# Patient Record
Sex: Female | Born: 1945 | Race: White | Hispanic: No | Marital: Single
Health system: Southern US, Community
[De-identification: ages and names within clinical notes are randomized; demographics above are authoritative.]

## PROBLEM LIST (undated history)

## (undated) HISTORY — PX: MASTECTOMY: SHX3

## (undated) HISTORY — PX: BREAST BIOPSY: SHX20

---

## 1998-03-22 ENCOUNTER — Encounter: Payer: Self-pay | Admitting: Family Medicine

## 1998-03-22 ENCOUNTER — Ambulatory Visit (HOSPITAL_COMMUNITY): Admission: RE | Admit: 1998-03-22 | Discharge: 1998-03-22 | Payer: Self-pay | Admitting: Family Medicine

## 1999-05-02 ENCOUNTER — Ambulatory Visit (HOSPITAL_COMMUNITY): Admission: RE | Admit: 1999-05-02 | Discharge: 1999-05-02 | Payer: Self-pay | Admitting: Family Medicine

## 1999-05-02 ENCOUNTER — Encounter: Payer: Self-pay | Admitting: Family Medicine

## 2000-03-11 ENCOUNTER — Encounter: Payer: Self-pay | Admitting: Family Medicine

## 2000-03-11 ENCOUNTER — Encounter: Admission: RE | Admit: 2000-03-11 | Discharge: 2000-03-11 | Payer: Self-pay | Admitting: Family Medicine

## 2000-05-06 ENCOUNTER — Encounter: Payer: Self-pay | Admitting: Family Medicine

## 2000-05-06 ENCOUNTER — Ambulatory Visit (HOSPITAL_COMMUNITY): Admission: RE | Admit: 2000-05-06 | Discharge: 2000-05-06 | Payer: Self-pay | Admitting: Family Medicine

## 2001-05-13 ENCOUNTER — Encounter: Payer: Self-pay | Admitting: Family Medicine

## 2001-05-13 ENCOUNTER — Ambulatory Visit (HOSPITAL_COMMUNITY): Admission: RE | Admit: 2001-05-13 | Discharge: 2001-05-13 | Payer: Self-pay | Admitting: Family Medicine

## 2002-06-18 ENCOUNTER — Ambulatory Visit (HOSPITAL_COMMUNITY): Admission: RE | Admit: 2002-06-18 | Discharge: 2002-06-18 | Payer: Self-pay | Admitting: Family Medicine

## 2002-06-18 ENCOUNTER — Encounter: Payer: Self-pay | Admitting: Family Medicine

## 2003-01-13 ENCOUNTER — Encounter: Admission: RE | Admit: 2003-01-13 | Discharge: 2003-01-13 | Payer: Self-pay | Admitting: Family Medicine

## 2003-01-13 ENCOUNTER — Encounter: Payer: Self-pay | Admitting: Family Medicine

## 2003-10-26 ENCOUNTER — Ambulatory Visit (HOSPITAL_COMMUNITY): Admission: RE | Admit: 2003-10-26 | Discharge: 2003-10-26 | Payer: Self-pay | Admitting: Family Medicine

## 2004-11-13 ENCOUNTER — Ambulatory Visit (HOSPITAL_COMMUNITY): Admission: RE | Admit: 2004-11-13 | Discharge: 2004-11-13 | Payer: Self-pay | Admitting: Family Medicine

## 2005-03-06 ENCOUNTER — Ambulatory Visit (HOSPITAL_COMMUNITY): Admission: RE | Admit: 2005-03-06 | Discharge: 2005-03-06 | Payer: Self-pay | Admitting: Gastroenterology

## 2006-01-17 ENCOUNTER — Ambulatory Visit (HOSPITAL_COMMUNITY): Admission: RE | Admit: 2006-01-17 | Discharge: 2006-01-17 | Payer: Self-pay | Admitting: Family Medicine

## 2007-02-26 ENCOUNTER — Ambulatory Visit (HOSPITAL_COMMUNITY): Admission: RE | Admit: 2007-02-26 | Discharge: 2007-02-26 | Payer: Self-pay | Admitting: Family Medicine

## 2008-03-30 ENCOUNTER — Ambulatory Visit (HOSPITAL_BASED_OUTPATIENT_CLINIC_OR_DEPARTMENT_OTHER): Admission: RE | Admit: 2008-03-30 | Discharge: 2008-03-30 | Payer: Self-pay | Admitting: Family Medicine

## 2008-04-29 ENCOUNTER — Ambulatory Visit (HOSPITAL_COMMUNITY): Admission: RE | Admit: 2008-04-29 | Discharge: 2008-04-29 | Payer: Self-pay | Admitting: Family Medicine

## 2009-04-07 ENCOUNTER — Ambulatory Visit: Payer: Self-pay | Admitting: Diagnostic Radiology

## 2009-04-07 ENCOUNTER — Ambulatory Visit (HOSPITAL_BASED_OUTPATIENT_CLINIC_OR_DEPARTMENT_OTHER): Admission: RE | Admit: 2009-04-07 | Discharge: 2009-04-07 | Payer: Self-pay | Admitting: Family Medicine

## 2009-05-12 ENCOUNTER — Ambulatory Visit (HOSPITAL_COMMUNITY): Admission: RE | Admit: 2009-05-12 | Discharge: 2009-05-12 | Payer: Self-pay | Admitting: Family Medicine

## 2010-04-18 ENCOUNTER — Ambulatory Visit: Payer: Self-pay | Admitting: Diagnostic Radiology

## 2010-04-18 ENCOUNTER — Ambulatory Visit (HOSPITAL_BASED_OUTPATIENT_CLINIC_OR_DEPARTMENT_OTHER): Admission: RE | Admit: 2010-04-18 | Discharge: 2010-04-18 | Payer: Self-pay | Admitting: Family Medicine

## 2010-08-27 ENCOUNTER — Encounter: Payer: Self-pay | Admitting: Family Medicine

## 2011-04-17 ENCOUNTER — Other Ambulatory Visit (HOSPITAL_BASED_OUTPATIENT_CLINIC_OR_DEPARTMENT_OTHER): Payer: Self-pay | Admitting: Family Medicine

## 2011-04-17 DIAGNOSIS — Z139 Encounter for screening, unspecified: Secondary | ICD-10-CM

## 2011-04-24 ENCOUNTER — Ambulatory Visit (HOSPITAL_BASED_OUTPATIENT_CLINIC_OR_DEPARTMENT_OTHER)
Admission: RE | Admit: 2011-04-24 | Discharge: 2011-04-24 | Disposition: A | Discharge status: Home / Self Care | Payer: Medicare Other | Source: Ambulatory Visit | Attending: Family Medicine | Admitting: Family Medicine

## 2011-04-24 DIAGNOSIS — Z139 Encounter for screening, unspecified: Secondary | ICD-10-CM

## 2011-04-24 DIAGNOSIS — Z1231 Encounter for screening mammogram for malignant neoplasm of breast: Secondary | ICD-10-CM

## 2011-05-08 ENCOUNTER — Other Ambulatory Visit (HOSPITAL_COMMUNITY): Payer: Self-pay | Admitting: Family Medicine

## 2011-05-16 ENCOUNTER — Ambulatory Visit (HOSPITAL_COMMUNITY): Payer: Medicare Other

## 2011-05-18 ENCOUNTER — Ambulatory Visit (HOSPITAL_COMMUNITY)
Admission: RE | Admit: 2011-05-18 | Discharge: 2011-05-18 | Disposition: A | Discharge status: Home / Self Care | Payer: Medicare Other | Source: Ambulatory Visit | Attending: Family Medicine | Admitting: Family Medicine

## 2011-05-18 DIAGNOSIS — Z780 Asymptomatic menopausal state: Secondary | ICD-10-CM | POA: Insufficient documentation

## 2011-05-18 DIAGNOSIS — Z13820 Encounter for screening for osteoporosis: Secondary | ICD-10-CM | POA: Insufficient documentation

## 2012-04-18 ENCOUNTER — Other Ambulatory Visit (HOSPITAL_BASED_OUTPATIENT_CLINIC_OR_DEPARTMENT_OTHER): Payer: Self-pay | Admitting: Family Medicine

## 2012-04-18 DIAGNOSIS — Z139 Encounter for screening, unspecified: Secondary | ICD-10-CM

## 2012-04-24 ENCOUNTER — Ambulatory Visit (HOSPITAL_BASED_OUTPATIENT_CLINIC_OR_DEPARTMENT_OTHER)
Admission: RE | Admit: 2012-04-24 | Discharge: 2012-04-24 | Disposition: A | Discharge status: Home / Self Care | Payer: Medicare Other | Source: Ambulatory Visit | Attending: Family Medicine | Admitting: Family Medicine

## 2012-04-24 DIAGNOSIS — Z139 Encounter for screening, unspecified: Secondary | ICD-10-CM

## 2012-04-24 DIAGNOSIS — Z1231 Encounter for screening mammogram for malignant neoplasm of breast: Secondary | ICD-10-CM | POA: Insufficient documentation

## 2013-04-20 ENCOUNTER — Other Ambulatory Visit (HOSPITAL_BASED_OUTPATIENT_CLINIC_OR_DEPARTMENT_OTHER): Payer: Self-pay | Admitting: Family Medicine

## 2013-04-20 DIAGNOSIS — Z1231 Encounter for screening mammogram for malignant neoplasm of breast: Secondary | ICD-10-CM

## 2013-04-27 ENCOUNTER — Ambulatory Visit (HOSPITAL_BASED_OUTPATIENT_CLINIC_OR_DEPARTMENT_OTHER)
Admission: RE | Admit: 2013-04-27 | Discharge: 2013-04-27 | Disposition: A | Discharge status: Home / Self Care | Payer: Medicare PPO | Source: Ambulatory Visit | Attending: Family Medicine | Admitting: Family Medicine

## 2013-04-27 DIAGNOSIS — Z1231 Encounter for screening mammogram for malignant neoplasm of breast: Secondary | ICD-10-CM

## 2014-04-27 ENCOUNTER — Other Ambulatory Visit (HOSPITAL_BASED_OUTPATIENT_CLINIC_OR_DEPARTMENT_OTHER): Payer: Self-pay | Admitting: Family Medicine

## 2014-04-27 DIAGNOSIS — Z1231 Encounter for screening mammogram for malignant neoplasm of breast: Secondary | ICD-10-CM

## 2014-05-10 ENCOUNTER — Ambulatory Visit (HOSPITAL_BASED_OUTPATIENT_CLINIC_OR_DEPARTMENT_OTHER)
Admission: RE | Admit: 2014-05-10 | Discharge: 2014-05-10 | Disposition: A | Discharge status: Home / Self Care | Payer: Medicare PPO | Source: Ambulatory Visit | Attending: Family Medicine | Admitting: Family Medicine

## 2014-05-10 DIAGNOSIS — Z1231 Encounter for screening mammogram for malignant neoplasm of breast: Secondary | ICD-10-CM

## 2014-12-23 ENCOUNTER — Telehealth: Payer: Self-pay | Admitting: Internal Medicine

## 2014-12-23 NOTE — Telephone Encounter (Signed)
[facility] 4 pages to Dr. Rhea BeltonPyrtle

## 2015-04-22 ENCOUNTER — Encounter: Payer: Self-pay | Admitting: Gastroenterology

## 2015-05-10 ENCOUNTER — Other Ambulatory Visit (HOSPITAL_BASED_OUTPATIENT_CLINIC_OR_DEPARTMENT_OTHER): Payer: Self-pay | Admitting: Family Medicine

## 2015-05-10 DIAGNOSIS — Z1231 Encounter for screening mammogram for malignant neoplasm of breast: Secondary | ICD-10-CM

## 2015-05-13 ENCOUNTER — Ambulatory Visit (HOSPITAL_BASED_OUTPATIENT_CLINIC_OR_DEPARTMENT_OTHER)
Admission: RE | Admit: 2015-05-13 | Discharge: 2015-05-13 | Disposition: A | Discharge status: Home / Self Care | Payer: Medicare PPO | Source: Ambulatory Visit | Attending: Family Medicine | Admitting: Family Medicine

## 2015-05-13 DIAGNOSIS — Z1231 Encounter for screening mammogram for malignant neoplasm of breast: Secondary | ICD-10-CM

## 2016-05-08 ENCOUNTER — Other Ambulatory Visit (HOSPITAL_BASED_OUTPATIENT_CLINIC_OR_DEPARTMENT_OTHER): Payer: Self-pay | Admitting: Family Medicine

## 2016-05-08 DIAGNOSIS — Z1231 Encounter for screening mammogram for malignant neoplasm of breast: Secondary | ICD-10-CM

## 2016-05-14 ENCOUNTER — Ambulatory Visit (HOSPITAL_BASED_OUTPATIENT_CLINIC_OR_DEPARTMENT_OTHER)
Admission: RE | Admit: 2016-05-14 | Discharge: 2016-05-14 | Disposition: A | Discharge status: Home / Self Care | Payer: Medicare Other | Source: Ambulatory Visit | Attending: Family Medicine | Admitting: Family Medicine

## 2016-05-14 DIAGNOSIS — Z1231 Encounter for screening mammogram for malignant neoplasm of breast: Secondary | ICD-10-CM | POA: Insufficient documentation

## 2017-05-13 ENCOUNTER — Other Ambulatory Visit (HOSPITAL_BASED_OUTPATIENT_CLINIC_OR_DEPARTMENT_OTHER): Payer: Self-pay | Admitting: Family Medicine

## 2017-05-13 DIAGNOSIS — Z1231 Encounter for screening mammogram for malignant neoplasm of breast: Secondary | ICD-10-CM

## 2017-05-21 ENCOUNTER — Ambulatory Visit (HOSPITAL_BASED_OUTPATIENT_CLINIC_OR_DEPARTMENT_OTHER)
Admission: RE | Admit: 2017-05-21 | Discharge: 2017-05-21 | Disposition: A | Discharge status: Home / Self Care | Payer: Medicare Other | Source: Ambulatory Visit | Attending: Family Medicine | Admitting: Family Medicine

## 2017-05-21 ENCOUNTER — Encounter (HOSPITAL_BASED_OUTPATIENT_CLINIC_OR_DEPARTMENT_OTHER): Payer: Self-pay

## 2017-05-21 DIAGNOSIS — Z1231 Encounter for screening mammogram for malignant neoplasm of breast: Secondary | ICD-10-CM | POA: Insufficient documentation

## 2018-05-12 ENCOUNTER — Other Ambulatory Visit (HOSPITAL_BASED_OUTPATIENT_CLINIC_OR_DEPARTMENT_OTHER): Payer: Self-pay | Admitting: Family Medicine

## 2018-05-12 DIAGNOSIS — Z1231 Encounter for screening mammogram for malignant neoplasm of breast: Secondary | ICD-10-CM

## 2018-05-22 ENCOUNTER — Ambulatory Visit (HOSPITAL_BASED_OUTPATIENT_CLINIC_OR_DEPARTMENT_OTHER)
Admission: RE | Admit: 2018-05-22 | Discharge: 2018-05-22 | Disposition: A | Discharge status: Home / Self Care | Payer: Medicare Other | Source: Ambulatory Visit | Attending: Family Medicine | Admitting: Family Medicine

## 2018-05-22 DIAGNOSIS — Z1231 Encounter for screening mammogram for malignant neoplasm of breast: Secondary | ICD-10-CM | POA: Insufficient documentation

## 2019-05-15 ENCOUNTER — Other Ambulatory Visit (HOSPITAL_BASED_OUTPATIENT_CLINIC_OR_DEPARTMENT_OTHER): Payer: Self-pay | Admitting: Family Medicine

## 2019-05-15 DIAGNOSIS — Z1239 Encounter for other screening for malignant neoplasm of breast: Secondary | ICD-10-CM

## 2019-05-25 ENCOUNTER — Ambulatory Visit (HOSPITAL_BASED_OUTPATIENT_CLINIC_OR_DEPARTMENT_OTHER)
Admission: RE | Admit: 2019-05-25 | Discharge: 2019-05-25 | Disposition: A | Discharge status: Home / Self Care | Payer: Medicare Other | Source: Ambulatory Visit | Attending: Family Medicine | Admitting: Family Medicine

## 2019-05-25 ENCOUNTER — Other Ambulatory Visit: Payer: Self-pay

## 2019-05-25 ENCOUNTER — Encounter (HOSPITAL_BASED_OUTPATIENT_CLINIC_OR_DEPARTMENT_OTHER): Payer: Self-pay

## 2019-05-25 DIAGNOSIS — Z1239 Encounter for other screening for malignant neoplasm of breast: Secondary | ICD-10-CM

## 2019-05-25 DIAGNOSIS — Z1231 Encounter for screening mammogram for malignant neoplasm of breast: Secondary | ICD-10-CM | POA: Diagnosis not present

## 2019-05-25 DIAGNOSIS — Z9011 Acquired absence of right breast and nipple: Secondary | ICD-10-CM | POA: Diagnosis not present

## 2019-10-16 ENCOUNTER — Ambulatory Visit: Payer: Medicare PPO | Attending: Internal Medicine

## 2019-10-16 DIAGNOSIS — Z23 Encounter for immunization: Secondary | ICD-10-CM

## 2019-10-16 NOTE — Progress Notes (Signed)
   Covid-19 Vaccination Clinic  Name:  KIYOMI PALLO    MRN: [id number] DOB: Dec 02, 1945  10/16/2019  Ms. Coull was observed post Covid-19 immunization for 15 minutes without incident. She was provided with Vaccine Information Sheet and instruction to access the V-Safe system.   Ms. Mckinzie was instructed to call 911 with any severe reactions post vaccine: Marland Kitchen Difficulty breathing  . Swelling of face and throat  . A fast heartbeat  . A bad rash all over body  . Dizziness and weakness   Immunizations Administered    Name Date Dose VIS Date Route   Pfizer COVID-19 Vaccine 10/16/2019  9:03 AM 0.3 mL 07/17/2019 Intramuscular   Manufacturer: [employer], [employer]   Lot: [id number]   NDC: [address]-0

## 2019-11-09 ENCOUNTER — Ambulatory Visit: Payer: Medicare PPO | Attending: Internal Medicine

## 2019-11-09 DIAGNOSIS — Z23 Encounter for immunization: Secondary | ICD-10-CM

## 2019-11-09 NOTE — Progress Notes (Signed)
   Covid-19 Vaccination Clinic  Name:  Linda Doyle    MRN: [id number] DOB: Feb 11, 1946  11/09/2019  [name] was observed post Covid-19 immunization for 15 minutes without incident. She was provided with Vaccine Information Sheet and instruction to access the V-Safe system.   [name] was instructed to call 911 with any severe reactions post vaccine: Marland Kitchen Difficulty breathing  . Swelling of face and throat  . A fast heartbeat  . A bad rash all over body  . Dizziness and weakness   Immunizations Administered    Name Date Dose VIS Date Route   Pfizer COVID-19 Vaccine 11/09/2019 11:39 AM 0.3 mL 07/17/2019 Intramuscular   Manufacturer: [employer], [employer]   Lot: [id number]   NDC: [address]-1

## 2020-01-26 LAB — COLOGUARD: COLOGUARD: NEGATIVE

## 2020-05-11 ENCOUNTER — Other Ambulatory Visit (HOSPITAL_BASED_OUTPATIENT_CLINIC_OR_DEPARTMENT_OTHER): Payer: Self-pay | Admitting: Family Medicine

## 2020-05-11 DIAGNOSIS — Z1231 Encounter for screening mammogram for malignant neoplasm of breast: Secondary | ICD-10-CM

## 2020-06-20 ENCOUNTER — Other Ambulatory Visit (HOSPITAL_BASED_OUTPATIENT_CLINIC_OR_DEPARTMENT_OTHER): Payer: Self-pay | Admitting: Family Medicine

## 2020-06-20 ENCOUNTER — Encounter (HOSPITAL_BASED_OUTPATIENT_CLINIC_OR_DEPARTMENT_OTHER): Payer: Self-pay

## 2020-06-20 ENCOUNTER — Ambulatory Visit (HOSPITAL_BASED_OUTPATIENT_CLINIC_OR_DEPARTMENT_OTHER)
Admission: RE | Admit: 2020-06-20 | Discharge: 2020-06-20 | Disposition: A | Discharge status: Home / Self Care | Payer: Medicare PPO | Source: Ambulatory Visit | Attending: Family Medicine | Admitting: Family Medicine

## 2020-06-20 ENCOUNTER — Other Ambulatory Visit: Payer: Self-pay

## 2020-06-20 DIAGNOSIS — Z1231 Encounter for screening mammogram for malignant neoplasm of breast: Secondary | ICD-10-CM | POA: Diagnosis not present

## 2021-04-02 IMAGING — MG DIGITAL SCREENING UNILAT LEFT W/ TOMO W/ CAD
4 series · 4 of 12 positions shown · non-contrast
Comparison: Previous exam(s).

CLINICAL DATA: Screening.

EXAM:
DIGITAL SCREENING UNILATERAL LEFT MAMMOGRAM WITH CAD AND TOMO

[L MLO synth-2D]
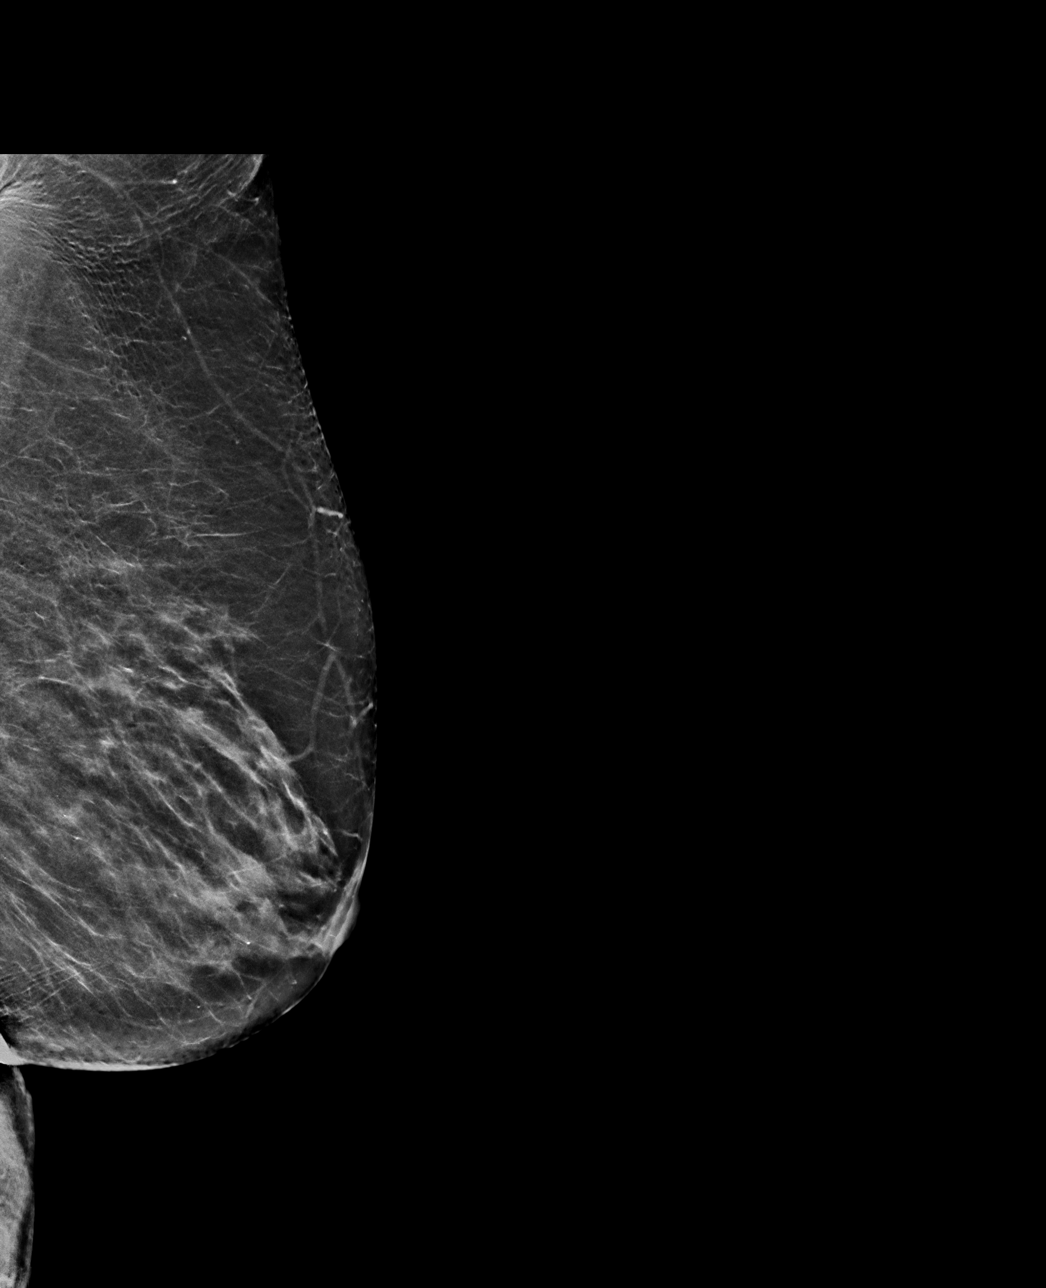

[L CC synth-2D]
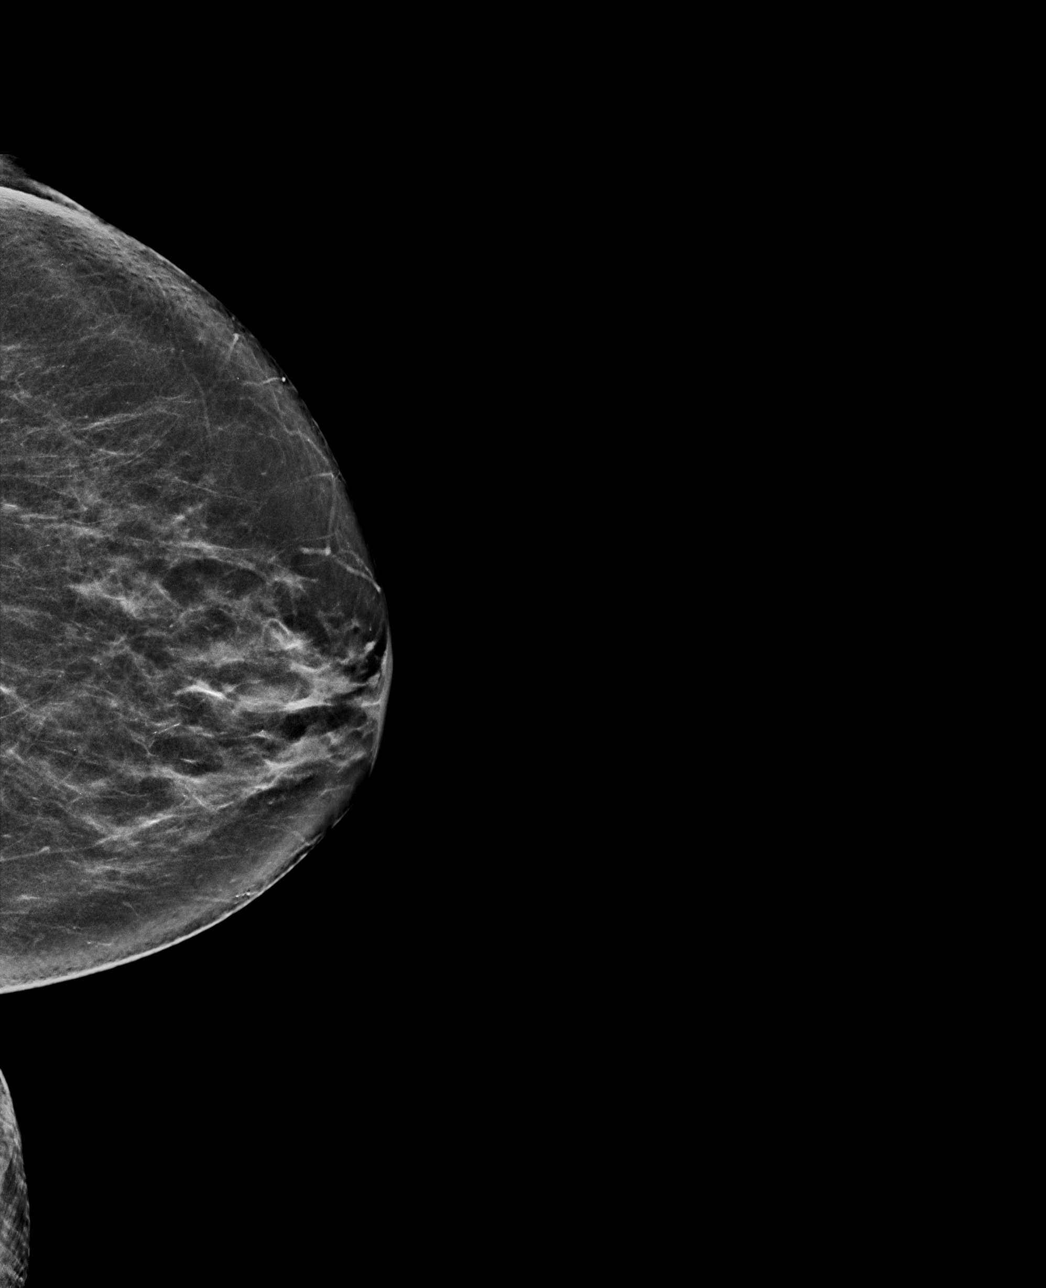

[L CC tomo · tomo slice 31/60.0]
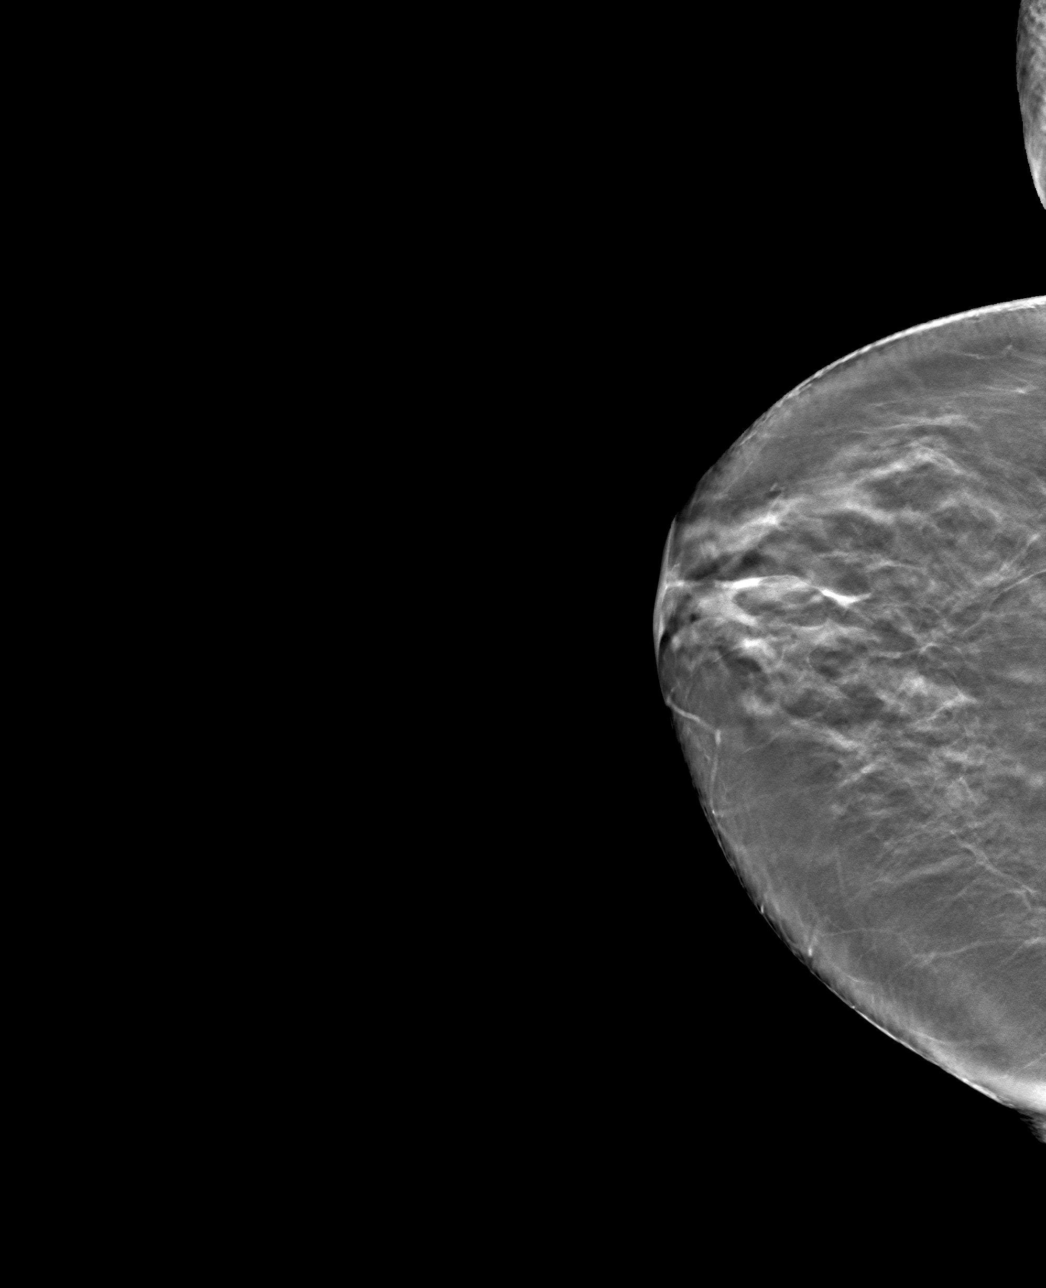

[L MLO tomo · tomo slice 33/64.0]
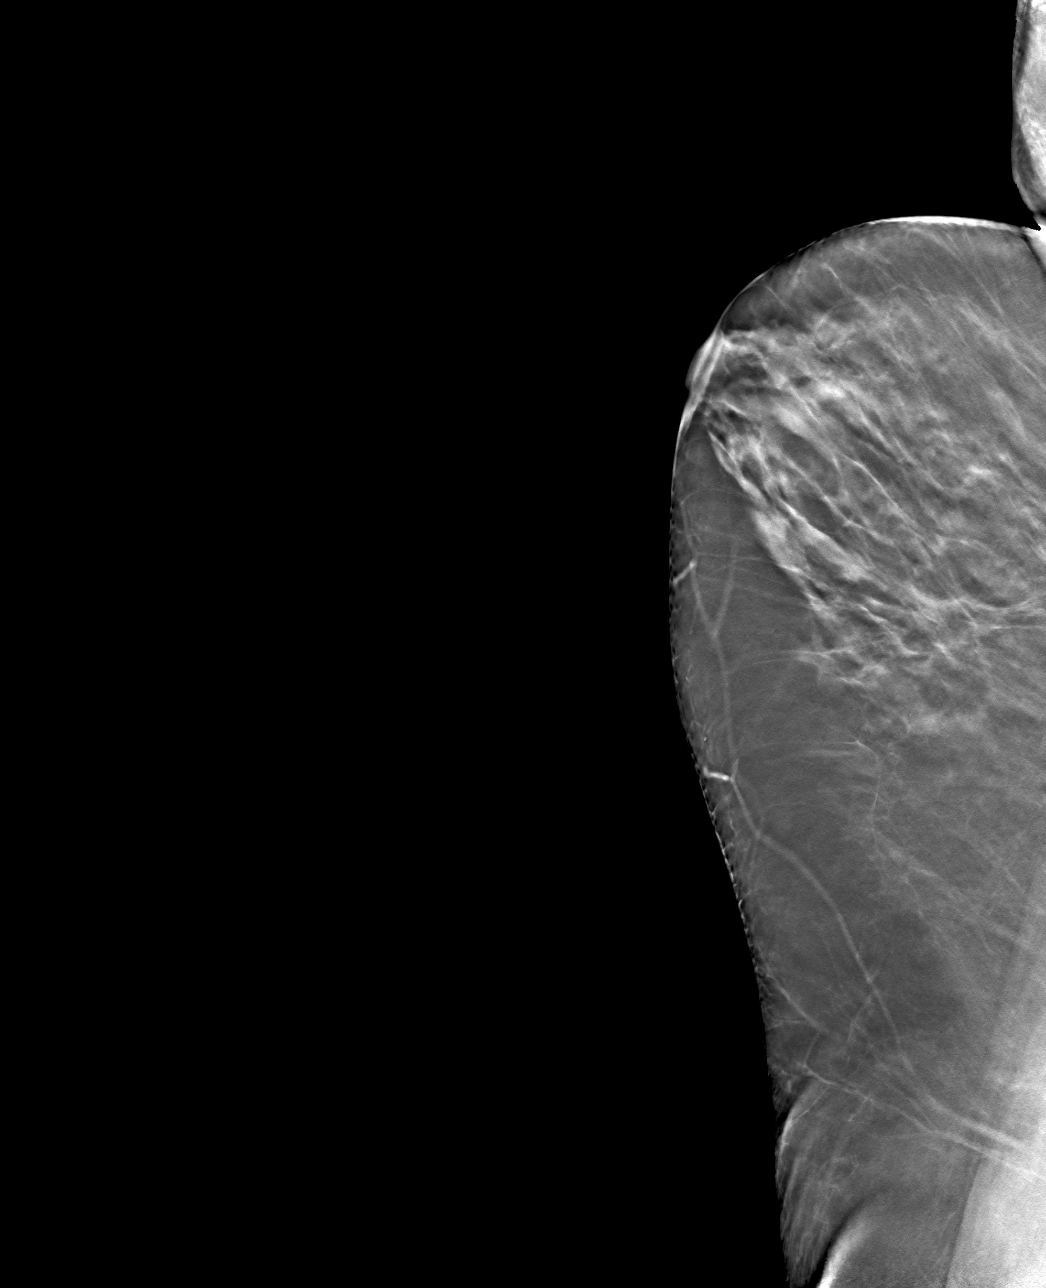

[4 of 12 positions shown; findings below may reference images not displayed]

ACR Breast Density Category c: The breast tissue is heterogeneously
dense, which may obscure small masses.
FINDINGS: The patient has had a right mastectomy. There are no findings
suspicious for malignancy.

Images were processed with CAD.
IMPRESSION: No mammographic evidence of malignancy. A result letter of this
screening mammogram will be mailed directly to the patient.

RECOMMENDATION:
Screening mammogram in one year.  (Code:SI-6-R6I)

BI-RADS CATEGORY  1: Negative.

## 2021-05-23 ENCOUNTER — Other Ambulatory Visit (HOSPITAL_BASED_OUTPATIENT_CLINIC_OR_DEPARTMENT_OTHER): Payer: Self-pay | Admitting: Family Medicine

## 2021-05-23 DIAGNOSIS — Z1231 Encounter for screening mammogram for malignant neoplasm of breast: Secondary | ICD-10-CM

## 2021-07-04 ENCOUNTER — Ambulatory Visit (HOSPITAL_BASED_OUTPATIENT_CLINIC_OR_DEPARTMENT_OTHER)
Admission: RE | Admit: 2021-07-04 | Discharge: 2021-07-04 | Disposition: A | Discharge status: Home / Self Care | Payer: Medicare PPO | Source: Ambulatory Visit | Attending: Family Medicine | Admitting: Family Medicine

## 2021-07-04 ENCOUNTER — Other Ambulatory Visit: Payer: Self-pay

## 2021-07-04 DIAGNOSIS — Z1231 Encounter for screening mammogram for malignant neoplasm of breast: Secondary | ICD-10-CM | POA: Insufficient documentation

## 2021-07-06 ENCOUNTER — Other Ambulatory Visit (HOSPITAL_BASED_OUTPATIENT_CLINIC_OR_DEPARTMENT_OTHER): Payer: Self-pay | Admitting: Family Medicine

## 2021-07-06 DIAGNOSIS — E041 Nontoxic single thyroid nodule: Secondary | ICD-10-CM

## 2021-07-10 ENCOUNTER — Ambulatory Visit (HOSPITAL_BASED_OUTPATIENT_CLINIC_OR_DEPARTMENT_OTHER)
Admission: RE | Admit: 2021-07-10 | Discharge: 2021-07-10 | Disposition: A | Discharge status: Home / Self Care | Payer: Medicare PPO | Source: Ambulatory Visit | Attending: Family Medicine | Admitting: Family Medicine

## 2021-07-10 ENCOUNTER — Other Ambulatory Visit: Payer: Self-pay

## 2021-07-10 DIAGNOSIS — E041 Nontoxic single thyroid nodule: Secondary | ICD-10-CM | POA: Insufficient documentation

## 2022-05-29 ENCOUNTER — Other Ambulatory Visit (HOSPITAL_BASED_OUTPATIENT_CLINIC_OR_DEPARTMENT_OTHER): Payer: Self-pay | Admitting: Family Medicine

## 2022-05-29 DIAGNOSIS — Z1231 Encounter for screening mammogram for malignant neoplasm of breast: Secondary | ICD-10-CM

## 2022-07-09 ENCOUNTER — Ambulatory Visit (HOSPITAL_BASED_OUTPATIENT_CLINIC_OR_DEPARTMENT_OTHER)
Admission: RE | Admit: 2022-07-09 | Discharge: 2022-07-09 | Disposition: A | Discharge status: Home / Self Care | Payer: Medicare PPO | Source: Ambulatory Visit | Attending: Family Medicine | Admitting: Family Medicine

## 2022-07-09 ENCOUNTER — Encounter (HOSPITAL_BASED_OUTPATIENT_CLINIC_OR_DEPARTMENT_OTHER): Payer: Self-pay

## 2022-07-09 ENCOUNTER — Other Ambulatory Visit (HOSPITAL_BASED_OUTPATIENT_CLINIC_OR_DEPARTMENT_OTHER): Payer: Self-pay | Admitting: Family Medicine

## 2022-07-09 DIAGNOSIS — Z1231 Encounter for screening mammogram for malignant neoplasm of breast: Secondary | ICD-10-CM | POA: Diagnosis present

## 2023-06-03 ENCOUNTER — Other Ambulatory Visit (HOSPITAL_BASED_OUTPATIENT_CLINIC_OR_DEPARTMENT_OTHER): Payer: Self-pay | Admitting: Family Medicine

## 2023-06-03 DIAGNOSIS — Z1231 Encounter for screening mammogram for malignant neoplasm of breast: Secondary | ICD-10-CM

## 2023-07-12 ENCOUNTER — Encounter (HOSPITAL_BASED_OUTPATIENT_CLINIC_OR_DEPARTMENT_OTHER): Payer: Self-pay | Admitting: Family Medicine

## 2023-07-15 ENCOUNTER — Ambulatory Visit (HOSPITAL_BASED_OUTPATIENT_CLINIC_OR_DEPARTMENT_OTHER)
Admission: RE | Admit: 2023-07-15 | Discharge: 2023-07-15 | Disposition: A | Discharge status: Home / Self Care | Payer: Medicare PPO | Source: Ambulatory Visit | Attending: Family Medicine | Admitting: Family Medicine

## 2023-07-15 ENCOUNTER — Other Ambulatory Visit (HOSPITAL_BASED_OUTPATIENT_CLINIC_OR_DEPARTMENT_OTHER): Payer: Self-pay | Admitting: Family Medicine

## 2023-07-15 DIAGNOSIS — Z1231 Encounter for screening mammogram for malignant neoplasm of breast: Secondary | ICD-10-CM

## 2024-06-16 ENCOUNTER — Other Ambulatory Visit (HOSPITAL_BASED_OUTPATIENT_CLINIC_OR_DEPARTMENT_OTHER): Payer: Self-pay | Admitting: Family Medicine

## 2024-06-16 DIAGNOSIS — Z1231 Encounter for screening mammogram for malignant neoplasm of breast: Secondary | ICD-10-CM

## 2024-07-21 ENCOUNTER — Inpatient Hospital Stay (HOSPITAL_BASED_OUTPATIENT_CLINIC_OR_DEPARTMENT_OTHER): Admission: RE | Admit: 2024-07-21 | Source: Ambulatory Visit

## 2024-07-23 ENCOUNTER — Other Ambulatory Visit (HOSPITAL_BASED_OUTPATIENT_CLINIC_OR_DEPARTMENT_OTHER): Payer: Self-pay | Admitting: Family Medicine

## 2024-07-23 ENCOUNTER — Encounter (HOSPITAL_BASED_OUTPATIENT_CLINIC_OR_DEPARTMENT_OTHER): Payer: Self-pay

## 2024-07-23 ENCOUNTER — Inpatient Hospital Stay (HOSPITAL_BASED_OUTPATIENT_CLINIC_OR_DEPARTMENT_OTHER): Admission: RE | Admit: 2024-07-23

## 2024-07-23 DIAGNOSIS — Z1231 Encounter for screening mammogram for malignant neoplasm of breast: Secondary | ICD-10-CM | POA: Insufficient documentation
